# Patient Record
Sex: Female | Born: 1990 | Race: Black or African American | Hispanic: No | Marital: Single | Smoking: Never smoker
Health system: Southern US, Community
[De-identification: ages and names within clinical notes are randomized; demographics above are authoritative.]

## PROBLEM LIST (undated history)

## (undated) DIAGNOSIS — Z789 Other specified health status: Secondary | ICD-10-CM

## (undated) DIAGNOSIS — D75A Glucose-6-phosphate dehydrogenase (G6PD) deficiency without anemia: Secondary | ICD-10-CM

## (undated) HISTORY — DX: Glucose-6-phosphate dehydrogenase (G6PD) deficiency without anemia: D75.A

## (undated) HISTORY — PX: BREAST SURGERY: SHX581

## (undated) HISTORY — PX: NO PAST SURGERIES: SHX2092

---

## 2009-09-11 ENCOUNTER — Emergency Department (HOSPITAL_COMMUNITY): Admission: EM | Admit: 2009-09-11 | Discharge: 2009-09-11 | Payer: Self-pay | Admitting: Family Medicine

## 2009-11-19 ENCOUNTER — Emergency Department (HOSPITAL_COMMUNITY): Admission: EM | Admit: 2009-11-19 | Discharge: 2009-11-19 | Payer: Self-pay | Admitting: Emergency Medicine

## 2009-12-07 ENCOUNTER — Emergency Department (HOSPITAL_BASED_OUTPATIENT_CLINIC_OR_DEPARTMENT_OTHER)
Admission: EM | Admit: 2009-12-07 | Discharge: 2009-12-08 | Payer: Self-pay | Source: Home / Self Care | Admitting: Emergency Medicine

## 2010-08-26 ENCOUNTER — Inpatient Hospital Stay (INDEPENDENT_AMBULATORY_CARE_PROVIDER_SITE_OTHER)
Admission: RE | Admit: 2010-08-26 | Discharge: 2010-08-26 | Disposition: A | Discharge status: Home / Self Care | Payer: BC Managed Care – PPO | Source: Ambulatory Visit | Attending: Family Medicine | Admitting: Family Medicine

## 2010-08-26 ENCOUNTER — Inpatient Hospital Stay (HOSPITAL_COMMUNITY)
Admission: AD | Admit: 2010-08-26 | Discharge: 2010-08-26 | Disposition: A | Discharge status: Home / Self Care | Payer: BC Managed Care – PPO | Source: Ambulatory Visit | Attending: Obstetrics & Gynecology | Admitting: Obstetrics & Gynecology

## 2010-08-26 ENCOUNTER — Encounter (HOSPITAL_COMMUNITY): Payer: Self-pay

## 2010-08-26 ENCOUNTER — Inpatient Hospital Stay (HOSPITAL_COMMUNITY): Payer: BC Managed Care – PPO

## 2010-08-26 DIAGNOSIS — N949 Unspecified condition associated with female genital organs and menstrual cycle: Secondary | ICD-10-CM

## 2010-08-26 DIAGNOSIS — R109 Unspecified abdominal pain: Secondary | ICD-10-CM | POA: Insufficient documentation

## 2010-08-26 HISTORY — DX: Other specified health status: Z78.9

## 2010-08-26 LAB — CBC
Platelets: 269 10*3/uL (ref 150–400)
RDW: 11.7 % (ref 11.5–15.5)
WBC: 5.9 10*3/uL (ref 4.0–10.5)

## 2010-08-26 LAB — WET PREP, GENITAL
Clue Cells Wet Prep HPF POC: NONE SEEN
Trich, Wet Prep: NONE SEEN
Yeast Wet Prep HPF POC: NONE SEEN

## 2010-08-26 LAB — POCT URINALYSIS DIP (DEVICE)
Bilirubin Urine: NEGATIVE
Glucose, UA: NEGATIVE mg/dL
Specific Gravity, Urine: 1.025 (ref 1.005–1.030)
Urobilinogen, UA: 0.2 mg/dL (ref 0.0–1.0)

## 2010-08-26 MED ORDER — IBUPROFEN 600 MG PO TABS: 600.0000 mg | ORAL_TABLET | Freq: Four times a day (QID) | ORAL | Status: AC | PRN

## 2010-08-26 NOTE — Progress Notes (Signed)
Pt states she has been on Depo for 2 years. Has a history of recurrent pyelo that she was recently treated for. Started having abdominal and low back pain 8-21. Went to Urgent Care this am and sent to [facility] for further evaluation.

## 2010-08-26 NOTE — ED Provider Notes (Signed)
History     Chief Complaint  Patient presents with  . Abdominal Pain   HPI Savannah Parrish 20 y.o. sent from [facility] urgent care to be evaluated for abdominal pain.  Currently pain is 7/10.  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  She has had a recent history of pyelo and reports being evaluated today as a follow up for labs after pyelo.  Has had sharp pain in lower abdomen.  Pelvic exam done at Urgent Care today.  Cultures pending.  Client is currently on Depo.  See also chart from Urgent Care earlier today.   OB History    Grav Para Term Preterm Abortions TAB SAB Ect Mult Living   0 0 0 0 0 0 0 0 0 0       Past Medical History  Diagnosis Date  . No pertinent past medical history     Past Surgical History  Procedure Date  . No past surgeries     No family history on file.  History  Substance Use Topics  . Smoking status: Never Smoker   . Smokeless tobacco: Never Used  . Alcohol Use: No    Allergies:  Allergies  Allergen Reactions  . Shellfish Allergy Other (See Comments)    Throat closes    Prescriptions prior to admission  Medication Sig Dispense Refill  . acetaminophen (TYLENOL) 500 MG tablet Take 1,000 mg by mouth as needed. For pain       . EPINEPHrine (EPI-PEN) 0.3 mg/0.3 mL DEVI Inject 0.3 mg into the muscle once. This is an emergent med. Pt has not use in about a year       . ibuprofen (ADVIL,MOTRIN) 200 MG tablet Take 600 mg by mouth as needed. For pain         Review of Systems  Gastrointestinal: Positive for abdominal pain and diarrhea.  Genitourinary:       No vaginal discharge. No vaginal bleeding. No dysuria.     Physical Exam   Blood pressure 116/73, pulse 99, temperature 98.9 F (37.2 C), temperature source Oral, resp. rate 16, height 5' 0.5" (1.537 m), weight 127 lb 3.2 oz (57.698 kg), SpO2 96.00%.  Physical Exam  Nursing note and vitals reviewed. Constitutional: She is oriented to person, place, and time.  She appears well-developed and well-nourished.       Client smiling and very alert.  Very pleasant.  In no distress.  HENT:  Head: Normocephalic.  Eyes: EOM are normal.  Neck: Neck supple.  GI: Soft. Bowel sounds are normal. She exhibits no distension and no mass. There is no tenderness. There is no rebound and no guarding.  Genitourinary:       Pelvic exam done at urgent care.  Musculoskeletal: Normal range of motion.  Neurological: She is alert and oriented to person, place, and time.  Skin: Skin is warm and dry.  Psychiatric: She has a normal mood and affect.    [facility] Course  Procedures Results for orders placed during the hospital encounter of 08/26/10 (from the past 24 hour(s))  CBC     Status: Normal   Collection Time   08/26/10 11:52 AM      Component Value Range   WBC 5.9  4.0 - 10.5 (K/uL)   RBC 4.05  3.87 - 5.11 (MIL/uL)   Hemoglobin 12.7  12.0 - 15.0 (g/dL)   HCT 78.2  95.6 - 21.3 (%)   MCV 93.6  78.0 - 100.0 (fL)  MCH 31.4  26.0 - 34.0 (pg)   MCHC 33.5  30.0 - 36.0 (g/dL)   RDW 40.9  81.1 - 91.4 (%)   Platelets 269  150 - 400 (K/uL)   See also results done at Urgent Care earlier today.  Urinalysis negative for infection, pregnancy test is negative, wet prep - few WBC, all else is negative, GC/Chlam pending.  MDM Reports pain 7/10.  Last pain medication was last night.  Declines any pain medication currently.  *RADIOLOGY REPORT*  Clinical Data: Right lower quadrant pelvic pain  TRANSABDOMINAL AND TRANSVAGINAL ULTRASOUND OF PELVIS  Technique: Both transabdominal and transvaginal ultrasound  examinations of the pelvis were performed. Transabdominal technique  was performed for global imaging of the pelvis including uterus,  ovaries, adnexal regions, and pelvic cul-de-sac.  Comparison: None.  It was necessary to proceed with endovaginal exam following the  transabdominal exam to visualize the ovaries, neither seen  transabdominally, and endometrium.  Findings:    Uterus: 5.9 x 3.4 x 2.3 cm. Anteverted, anteflexed. Normal.  Endometrium: 5 mm. Uniformly thin and echogenic.  Right ovary: 2.7 x 1.8 x 1.2 cm. Normal.  Left ovary: 2.4 x 2.0 x 1.3 cm. Normal.  Other findings: Small amount of free fluid noted in the cul-de-sac.  IMPRESSION:  Normal exam.   Assessment and Plan  Abdominal pain  Plan: Follow up with medical provider of choice. BRAT diet. Will prescribe Ibuprofen for pain.  Argus Caraher 08/26/2010, 11:59 AM

## 2010-08-27 LAB — GC/CHLAMYDIA PROBE AMP, GENITAL
Chlamydia, DNA Probe: NEGATIVE
GC Probe Amp, Genital: NEGATIVE

## 2011-03-12 ENCOUNTER — Emergency Department (HOSPITAL_COMMUNITY)
Admission: EM | Admit: 2011-03-12 | Discharge: 2011-03-12 | Disposition: A | Discharge status: Home Health | Payer: BC Managed Care – PPO | Attending: Emergency Medicine | Admitting: Emergency Medicine

## 2011-03-12 ENCOUNTER — Encounter (HOSPITAL_COMMUNITY): Payer: Self-pay | Admitting: *Deleted

## 2011-03-12 DIAGNOSIS — E860 Dehydration: Secondary | ICD-10-CM | POA: Insufficient documentation

## 2011-03-12 DIAGNOSIS — R599 Enlarged lymph nodes, unspecified: Secondary | ICD-10-CM | POA: Insufficient documentation

## 2011-03-12 DIAGNOSIS — Z79899 Other long term (current) drug therapy: Secondary | ICD-10-CM | POA: Insufficient documentation

## 2011-03-12 DIAGNOSIS — R221 Localized swelling, mass and lump, neck: Secondary | ICD-10-CM | POA: Insufficient documentation

## 2011-03-12 DIAGNOSIS — R220 Localized swelling, mass and lump, head: Secondary | ICD-10-CM | POA: Insufficient documentation

## 2011-03-12 DIAGNOSIS — IMO0001 Reserved for inherently not codable concepts without codable children: Secondary | ICD-10-CM | POA: Insufficient documentation

## 2011-03-12 DIAGNOSIS — J020 Streptococcal pharyngitis: Secondary | ICD-10-CM | POA: Insufficient documentation

## 2011-03-12 DIAGNOSIS — R509 Fever, unspecified: Secondary | ICD-10-CM | POA: Insufficient documentation

## 2011-03-12 LAB — POCT I-STAT, CHEM 8
Calcium, Ion: 1.21 mmol/L (ref 1.12–1.32)
HCT: 38 % (ref 36.0–46.0)
TCO2: 21 mmol/L (ref 0–100)

## 2011-03-12 LAB — RAPID STREP SCREEN (MED CTR MEBANE ONLY): Streptococcus, Group A Screen (Direct): POSITIVE — AB

## 2011-03-12 MED ORDER — DEXAMETHASONE SODIUM PHOSPHATE 10 MG/ML IJ SOLN
10.0000 mg | Freq: Once | INTRAMUSCULAR | Status: AC
  Administered 2011-03-12: 10 mg via INTRAVENOUS
  Filled 2011-03-12: qty 1

## 2011-03-12 MED ORDER — PENICILLIN G BENZATHINE 1200000 UNIT/2ML IM SUSP
1.2000 10*6.[IU] | Freq: Once | INTRAMUSCULAR | Status: AC
  Administered 2011-03-12: 1.2 10*6.[IU] via INTRAMUSCULAR
  Filled 2011-03-12: qty 2

## 2011-03-12 MED ORDER — SODIUM CHLORIDE 0.9 % IV BOLUS (SEPSIS)
500.0000 mL | Freq: Once | INTRAVENOUS | Status: AC
  Administered 2011-03-12: 500 mL via INTRAVENOUS

## 2011-03-12 MED ORDER — ONDANSETRON HCL 4 MG/2ML IJ SOLN
4.0000 mg | Freq: Once | INTRAMUSCULAR | Status: AC
  Administered 2011-03-12: 4 mg via INTRAVENOUS
  Filled 2011-03-12: qty 2

## 2011-03-12 MED ORDER — HYDROCODONE-ACETAMINOPHEN 7.5-325 MG/15ML PO SOLN: 15.0000 mL | ORAL | Status: AC | PRN

## 2011-03-12 MED ORDER — MORPHINE SULFATE 4 MG/ML IJ SOLN
4.0000 mg | Freq: Once | INTRAMUSCULAR | Status: AC
  Administered 2011-03-12: 4 mg via INTRAVENOUS
  Filled 2011-03-12: qty 1

## 2011-03-12 MED ORDER — SODIUM CHLORIDE 0.9 % IV BOLUS (SEPSIS)
1000.0000 mL | Freq: Once | INTRAVENOUS | Status: AC
  Administered 2011-03-12: 1000 mL via INTRAVENOUS

## 2011-03-12 NOTE — ED Notes (Signed)
sorethroat and chills since yesterday.  No other complaints

## 2011-03-12 NOTE — ED Provider Notes (Signed)
History     CSN: [id number]  Arrival date & time 03/12/11  2026   First MD Initiated Contact with Patient 03/12/11 2104      Chief Complaint  Patient presents with  . Sore Throat     Patient is a 21 y.o. female presenting with pharyngitis.  Sore Throat This is a new problem. The current episode started yesterday. The problem occurs constantly. The problem has been gradually worsening. Associated symptoms include a fever, myalgias, a sore throat and swollen glands. Pertinent negatives include no abdominal pain, chills, congestion, coughing, headaches, nausea, neck pain, rash, urinary symptoms or vomiting. The symptoms are aggravated by swallowing. She has tried acetaminophen and NSAIDs for the symptoms. The treatment provided no relief.   patient reports onset of severe sore throat yesterday. Pain is worse on the left side. This made it very painful for patient to eat or drink. Denies nausea, vomiting, diarrhea and cough. Does state that she feels as though she has had fever.  Past Medical History  Diagnosis Date  . No pertinent past medical history     Past Surgical History  Procedure Date  . No past surgeries     History reviewed. No pertinent family history.  History  Substance Use Topics  . Smoking status: Never Smoker   . Smokeless tobacco: Never Used  . Alcohol Use: No    OB History    Grav Para Term Preterm Abortions TAB SAB Ect Mult Living   0 0 0 0 0 0 0 0 0 0       Review of Systems  Constitutional: Positive for fever. Negative for chills.  HENT: Positive for sore throat. Negative for congestion and neck pain.   Eyes: Negative.   Respiratory: Negative.  Negative for cough.   Cardiovascular: Negative.   Gastrointestinal: Negative.  Negative for nausea, vomiting and abdominal pain.  Genitourinary: Negative.   Musculoskeletal: Positive for myalgias.  Skin: Negative.  Negative for rash.  Neurological: Negative.  Negative for headaches.  Hematological:  Negative.   Psychiatric/Behavioral: Negative.     Allergies  Shellfish allergy  Home Medications   Current Outpatient Rx  Name Route Sig Dispense Refill  . EPINEPHRINE 0.3 MG/0.3ML IJ DEVI Intramuscular Inject 0.3 mg into the muscle once. This is an emergent med. Pt has not use in about a year     . SERTRALINE HCL 25 MG PO TABS Oral Take 25 mg by mouth daily.      BP 110/75  Pulse 96  Temp(Src) 98.6 F (37 C) (Oral)  Resp 16  SpO2 99%  Physical Exam  Constitutional: She is oriented to person, place, and time. She appears well-developed and well-nourished.  HENT:  Head: Normocephalic and atraumatic.  Right Ear: Tympanic membrane and external ear normal.  Left Ear: Tympanic membrane, external ear and ear canal normal.  Nose: Nose normal.  Mouth/Throat: Uvula is midline. Posterior oropharyngeal edema, posterior oropharyngeal erythema and tonsillar abscesses present.       Bilateral tonsils grossly swollen (2+) and erythematous with purulent exudate noted  Eyes: Conjunctivae are normal.  Neck: Neck supple.  Cardiovascular: Normal rate and regular rhythm.   Pulmonary/Chest: Effort normal and breath sounds normal.  Abdominal: Soft. Bowel sounds are normal.  Musculoskeletal: Normal range of motion.  Neurological: She is alert and oriented to person, place, and time.  Skin: Skin is warm and dry. No erythema.  Psychiatric: She has a normal mood and affect.    ED Course  Procedures  Findings single impression discussed with patient. Will plan for discharge home with short course of liquid medication for pain and encourage follow up with her primary care physician as needed. Patient is to return for worsening symptoms. Patient requesting ENT referral as she has frequent tonsillitis.  Labs Reviewed  RAPID STREP SCREEN - Abnormal; Notable for the following:    Streptococcus, Group A Screen (Direct) POSITIVE (*)    All other components within normal limits  POCT I-STAT, CHEM 8 -  Abnormal; Notable for the following:    Potassium 3.3 (*)    All other components within normal limits   No results found.   No diagnosis found.    MDM  HPI/PE and clinical findings c/w 1. Strep Pharyngitis 2. Dehydration        Leanne Chang, NP 03/12/11 [phone]

## 2011-03-12 NOTE — Discharge Instructions (Signed)
Please review the instructions below. You were treated in the emergency department tonight for your sore throat. We have treated you for her strep pharyngitis (strep throat). You were also quite dehydrated since the pain you're throat is made difficult to eat and drink. Your dehydration is much improved after IV fluids. Take the liquid pain medication as needed, drink plenty of fluids and followup with your primary care physician as needed. We have provided a referral to the ENT specialist as you requested. Return for worsening symptoms otherwise follow up as discussed.     Dehydration, Adult Dehydration means your body does not have as much fluid as it needs. Your kidneys, brain, and heart will not work properly without the right amount of fluids and salt.  HOME CARE  Ask your doctor how to replace body fluid losses (rehydrate).   Drink enough fluids to keep your pee (urine) clear or pale yellow.   Drink small amounts of fluids often if you feel sick to your stomach (nauseous) or throw up (vomit).   Eat like you normally do.   Avoid:   Foods or drinks high in sugar.   Bubbly (carbonated) drinks.   Juice.   Very hot or cold fluids.   Drinks with caffeine.   Fatty, greasy foods.   Alcohol.   Tobacco.   Eating too much.   Gelatin desserts.   Wash your hands to avoid spreading germs (bacteria, viruses).   Only take medicine as told by your doctor.   Keep all doctor visits as told.  GET HELP RIGHT AWAY IF:   You cannot drink something without throwing up.   You get worse even with treatment.   Your vomit has blood in it or looks greenish.   Your poop (stool) has blood in it or looks black and tarry.   You have not peed in 6 to 8 hours.   You pee a small amount of very dark pee.   You have a fever.   You pass out (faint).   You have belly (abdominal) pain that gets worse or stays in one spot (localizes).   You have a rash, stiff neck, or bad headache.    You get easily annoyed, sleepy, or are hard to wake up.   You feel weak, dizzy, or very thirsty.  MAKE SURE YOU:   Understand these instructions.   Will watch your condition.   Will get help right away if you are not doing well or get worse.  Document Released: 10/17/2008 Document Revised: 12/10/2010 Document Reviewed: 08/10/2010 [facility] Patient Information 2012 [place], [place].Salt Water Gargle This solution will help make your mouth and throat feel better. HOME CARE INSTRUCTIONS   Mix 1 teaspoon of salt in 8 ounces of warm water.   Gargle with this solution as much or often as you need or as directed. Swish and gargle gently if you have any sores or wounds in your mouth.   Do not swallow this mixture.  Document Released: 09/25/2003 Document Revised: 12/10/2010 Document Reviewed: 02/16/2008 [facility] Patient Information 2012 [place], [place].Strep Throat Strep throat is an infection of the throat. It is caused by a germ. Strep throat spreads from person to person by coughing, sneezing, or close contact. HOME CARE  Rinse your mouth (gargle) with warm salt water (1 teaspoon salt in 1 cup of water). Do this 3 to 4 times per day or as needed for comfort.   Family members with a sore throat or fever should see a doctor.  Make sure everyone in your house washes their hands well.   Do not share food, drinking cups, or personal items.   Eat soft foods until your sore throat gets better.   Drink enough water and fluids to keep your pee (urine) clear or pale yellow.   Rest.   Stay home from school, daycare, or work until you have taken medicine for 24 hours.   Only take medicine as told by your doctor.   Take your medicine as told. Finish it even if you start to feel better.  GET HELP RIGHT AWAY IF:   You have new problems, such as throwing up (vomiting) or bad headaches.   You have a stiff or painful neck, chest pain, trouble breathing, or trouble swallowing.   You have  very bad throat pain, drooling, or changes in your voice.   Your neck puffs up (swells) or gets red and tender.   You have a fever.   You are very tired, your mouth is dry, or you are peeing less than normal.   You cannot wake up completely.   You get a rash, cough, or earache.   You have green, yellow-brown, or bloody spit.   Your pain does not get better with medicine.  MAKE SURE YOU:   Understand these instructions.   Will watch your condition.   Will get help right away if you are not doing well or get worse.  Document Released: 06/09/2007 Document Revised: 12/10/2010 Document Reviewed: 02/19/2010 [facility] Patient Information 2012 [place], [place].

## 2011-03-13 NOTE — ED Provider Notes (Signed)
Medical screening examination/treatment/procedure(s) were performed by non-physician practitioner and as supervising physician I was immediately available for consultation/collaboration.   Nat Christen, MD 03/13/11 [phone]

## 2011-04-28 ENCOUNTER — Encounter (HOSPITAL_COMMUNITY): Payer: Self-pay | Admitting: *Deleted

## 2011-04-28 ENCOUNTER — Emergency Department (INDEPENDENT_AMBULATORY_CARE_PROVIDER_SITE_OTHER)
Admission: EM | Admit: 2011-04-28 | Discharge: 2011-04-28 | Disposition: A | Discharge status: Home / Self Care | Payer: BC Managed Care – PPO | Source: Home / Self Care | Attending: Emergency Medicine | Admitting: Emergency Medicine

## 2011-04-28 DIAGNOSIS — R102 Pelvic and perineal pain: Secondary | ICD-10-CM

## 2011-04-28 DIAGNOSIS — N949 Unspecified condition associated with female genital organs and menstrual cycle: Secondary | ICD-10-CM

## 2011-04-28 LAB — POCT URINALYSIS DIP (DEVICE)
Leukocytes, UA: NEGATIVE
Nitrite: NEGATIVE
Protein, ur: NEGATIVE mg/dL
pH: 6 (ref 5.0–8.0)

## 2011-04-28 LAB — POCT PREGNANCY, URINE: Preg Test, Ur: NEGATIVE

## 2011-04-28 MED ORDER — POLYETHYLENE GLYCOL 3350 17 GM/SCOOP PO POWD: 17.0000 g | Freq: Two times a day (BID) | ORAL | Status: AC

## 2011-04-28 NOTE — ED Provider Notes (Signed)
Medical screening examination/treatment/procedure(s) were performed by resident physician,I'M supervising physician I was immediately available for consultation/collaboration.I have examined and discussed management and treatment plan and follow-up with patient as well.    Jimmie Molly, MD 04/28/11 [phone]

## 2011-04-28 NOTE — ED Notes (Signed)
Pt is here with complaints of lower abdominal pain X "a few months" with nausea around 4 or 5 am and generalized malaise.  Pt thinks she might be pregnant although a home test was negative.  Pt does report occasional chest discomfort after eating a meal.  Denies possible STD exposure, last STD screening in January.

## 2011-04-28 NOTE — ED Provider Notes (Signed)
History     CSN: [id number]  Arrival date & time 04/28/11  1003   First MD Initiated Contact with Patient 04/28/11 1137      Chief Complaint  Patient presents with  . Abdominal Pain    (Consider location/radiation/quality/duration/timing/severity/associated sxs/prior treatment) HPI Patient presents with pelvic pain x3 months. She says that over the last week her pain has been getting slightly worse. She describes it as crampy. She has not tried anything for this pain. She describes this pain as bilateral pelvic as well as suprapubic. She has not had any burning when she pees but she has had increased frequency. She has not had any discharge, odor, itch. She has not had a menstrual period since she started the nexplanon in January. She is concerned she is pregnant because she had unprotected sex in January. She said that she has been screened for STDs since then.  Patient states that every morning at 4 AM she'll wake up with a feeling of nausea. The last several months she is possibly vomited once or twice but it is not frequent. She has not had an acid taste in her mouth and she does not have heartburn symptoms.   She says that she has bowel movements every other day that are small hard balls. This has been going on for months. . Past Medical History  Diagnosis Date  . No pertinent past medical history     Past Surgical History  Procedure Date  . No past surgeries     History reviewed. No pertinent family history.  History  Substance Use Topics  . Smoking status: Never Smoker   . Smokeless tobacco: Never Used  . Alcohol Use: No    OB History    Grav Para Term Preterm Abortions TAB SAB Ect Mult Living   0 0 0 0 0 0 0 0 0 0       Review of Systems As above, otherwise negative Allergies  Shellfish allergy  Home Medications   Current Outpatient Rx  Name Route Sig Dispense Refill  . EPINEPHRINE 0.3 MG/0.3ML IJ DEVI Intramuscular Inject 0.3 mg into the muscle once. This  is an emergent med. Pt has not use in about a year     . POLYETHYLENE GLYCOL 3350 PO POWD Oral Take 17 g by mouth 2 (two) times daily. 527 g 0  . SERTRALINE HCL 25 MG PO TABS Oral Take 25 mg by mouth daily.      BP 120/76  Pulse 83  Temp(Src) 98.4 F (36.9 C) (Oral)  Resp 18  SpO2 97%  Physical Exam Vital signs reviewed General appearance - alert, well appearing, and in no distress Heart - normal rate, regular rhythm, normal S1, S2, no murmurs, rubs, clicks or gallops Chest - clear to auscultation, no wheezes, rales or rhonchi, symmetric air entry, no tachypnea, retractions or cyanosis Abdomen - soft, mild tenderness suprapubic and bilateral pelvic, nondistended, no masses or organomegaly GYN-patient is tender at the introitus. She has normal appearance externally. Internally, her vagina is normal color and with some thin white discharge present. her cervix has some white discharge that is non-purulent-appearing. She has some tenderness with motion of the cervix. Her uterus is normal size and shape. ED Course  Procedures (including critical care time)  Labs Reviewed  POCT URINALYSIS DIP (DEVICE) - Abnormal; Notable for the following:    Ketones, ur TRACE (*)    All other components within normal limits  POCT PREGNANCY, URINE  WET PREP, GENITAL  GC/CHLAMYDIA PROBE AMP, GENITAL   No results found.   1. Pelvic pain       MDM  1. Pelvic pain-patient with several months of pelvic pain. She has some tenderness with cervical motion but her discharge appears vaginal and normal in character. Her urine is normal on the dipstick. I have sent gonorrhea and chlamydia as well as wet prep. Patient has been tested for STDs since her last sexual encounter, so she does not feel she needs to be treated empirically today. As she has some symptoms of constipation, I have given her MiraLAX. I have reviewed with her that if her STD testing is negative but her symptoms do not improve, she is to follow  up with GYN. I have given her a referral today. I have asked her to use Aleve twice a day x5 days to help with her symptoms.        Reginold Agent, MD 04/28/11 1228

## 2011-04-28 NOTE — Discharge Instructions (Signed)
Please take Aleve twice a day for 5 days with food Please take MiraLAX for constipation twice a day Please see the GYN doctor if you're not feeling better in 7-10 days Abdominal Pain, Women Abdominal (stomach, pelvic, or belly) pain can be caused by many things. It is important to tell your doctor:  The location of the pain.   Does it come and go or is it present all the time?   Are there things that start the pain (eating certain foods, exercise)?   Are there other symptoms associated with the pain (fever, nausea, vomiting, diarrhea)?  All of this is helpful to know when trying to find the cause of the pain. CAUSES   Stomach: virus or bacteria infection, or ulcer.   Intestine: appendicitis (inflamed appendix), regional ileitis (Crohn's disease), ulcerative colitis (inflamed colon), irritable bowel syndrome, diverticulitis (inflamed diverticulum of the colon), or cancer of the stomach or intestine.   Gallbladder disease or stones in the gallbladder.   Kidney disease, kidney stones, or infection.   Pancreas infection or cancer.   Fibromyalgia (pain disorder).   Diseases of the female organs:   Uterus: fibroid (non-cancerous) tumors or infection.   Fallopian tubes: infection or tubal pregnancy.   Ovary: cysts or tumors.   Pelvic adhesions (scar tissue).   Endometriosis (uterus lining tissue growing in the pelvis and on the pelvic organs).   Pelvic congestion syndrome (female organs filling up with blood just before the menstrual period).   Pain with the menstrual period.   Pain with ovulation (producing an egg).   Pain with an IUD (intrauterine device, birth control) in the uterus.   Cancer of the female organs.   Functional pain (pain not caused by a disease, may improve without treatment).   Psychological pain.   Depression.  DIAGNOSIS  Your doctor will decide the seriousness of your pain by doing an examination.  Blood tests.   X-rays.   Ultrasound.    CT scan (computed tomography, special type of X-ray).   MRI (magnetic resonance imaging).   Cultures, for infection.   Barium enema (dye inserted in the large intestine, to better view it with X-rays).   Colonoscopy (looking in intestine with a lighted tube).   Laparoscopy (minor surgery, looking in abdomen with a lighted tube).   Major abdominal exploratory surgery (looking in abdomen with a large incision).  TREATMENT  The treatment will depend on the cause of the pain.   Many cases can be observed and treated at home.   Over-the-counter medicines recommended by your caregiver.   Prescription medicine.   Antibiotics, for infection.   Birth control pills, for painful periods or for ovulation pain.   Hormone treatment, for endometriosis.   Nerve blocking injections.   Physical therapy.   Antidepressants.   Counseling with a psychologist or psychiatrist.   Minor or major surgery.  HOME CARE INSTRUCTIONS   Do not take laxatives, unless directed by your caregiver.   Take over-the-counter pain medicine only if ordered by your caregiver. Do not take aspirin because it can cause an upset stomach or bleeding.   Try a clear liquid diet (broth or water) as ordered by your caregiver. Slowly move to a bland diet, as tolerated, if the pain is related to the stomach or intestine.   Have a thermometer and take your temperature several times a day, and record it.   Bed rest and sleep, if it helps the pain.   Avoid sexual intercourse, if it causes pain.  Avoid stressful situations.   Keep your follow-up appointments and tests, as your caregiver orders.   If the pain does not go away with medicine or surgery, you may try:   Acupuncture.   Relaxation exercises (yoga, meditation).   Group therapy.   Counseling.  SEEK MEDICAL CARE IF:   You notice certain foods cause stomach pain.   Your home care treatment is not helping your pain.   You need stronger pain  medicine.   You want your IUD removed.   You feel faint or lightheaded.   You develop nausea and vomiting.   You develop a rash.   You are having side effects or an allergy to your medicine.  SEEK IMMEDIATE MEDICAL CARE IF:   Your pain does not go away or gets worse.   You have a fever.   Your pain is felt only in portions of the abdomen. The right side could possibly be appendicitis. The left lower portion of the abdomen could be colitis or diverticulitis.   You are passing blood in your stools (bright red or black tarry stools, with or without vomiting).   You have blood in your urine.   You develop chills, with or without a fever.   You pass out.  MAKE SURE YOU:   Understand these instructions.   Will watch your condition.   Will get help right away if you are not doing well or get worse.  Document Released: 10/18/2006 Document Revised: 12/10/2010 Document Reviewed: 11/07/2008 [facility] Patient Information 2012 [place], [place].

## 2011-04-30 ENCOUNTER — Telehealth (HOSPITAL_COMMUNITY): Payer: Self-pay | Admitting: *Deleted

## 2011-04-30 LAB — GC/CHLAMYDIA PROBE AMP, GENITAL: Chlamydia, DNA Probe: POSITIVE — AB

## 2011-04-30 NOTE — ED Notes (Signed)
Chlamydia positive.  Pt called and verified X 2.  Rx for Azithromycin 1gm PO X 1 called to [facility] on [address] per Dr. Chaney Malling.  Pt. instructed to notify their partner, no sex for 1 week and to practice safe sex. Pt. told they can get HIV testing at the [facility] STD clinic.

## 2011-04-30 NOTE — ED Notes (Signed)
Pt called this afternoon stating that she took the medicine that was called in for her and it has made feel sick to her stomach.  She states she had to call out of work and was requesting a work note.  I told her that she could have it for today only, to return tomorrow.  Work note printed and left at registration for pt to pick up.  Pt informed that she would need to come in to pick it up.  Pt voiced understanding.

## 2011-07-07 ENCOUNTER — Emergency Department (HOSPITAL_COMMUNITY)
Admission: EM | Admit: 2011-07-07 | Discharge: 2011-07-07 | Disposition: A | Discharge status: Home / Self Care | Payer: No Typology Code available for payment source | Attending: Emergency Medicine | Admitting: Emergency Medicine

## 2011-07-07 ENCOUNTER — Emergency Department (HOSPITAL_COMMUNITY): Payer: No Typology Code available for payment source

## 2011-07-07 ENCOUNTER — Encounter (HOSPITAL_COMMUNITY): Payer: Self-pay | Admitting: Physical Medicine and Rehabilitation

## 2011-07-07 DIAGNOSIS — R51 Headache: Secondary | ICD-10-CM

## 2011-07-07 DIAGNOSIS — M25519 Pain in unspecified shoulder: Secondary | ICD-10-CM | POA: Insufficient documentation

## 2011-07-07 DIAGNOSIS — M546 Pain in thoracic spine: Secondary | ICD-10-CM | POA: Insufficient documentation

## 2011-07-07 DIAGNOSIS — M549 Dorsalgia, unspecified: Secondary | ICD-10-CM

## 2011-07-07 MED ORDER — KETOROLAC TROMETHAMINE 30 MG/ML IJ SOLN
30.0000 mg | Freq: Once | INTRAMUSCULAR | Status: AC
  Administered 2011-07-07: 30 mg via INTRAMUSCULAR
  Filled 2011-07-07: qty 1

## 2011-07-07 MED ORDER — METHOCARBAMOL 500 MG PO TABS: 500.0000 mg | ORAL_TABLET | Freq: Four times a day (QID) | ORAL | Status: AC | PRN

## 2011-07-07 MED ORDER — METHOCARBAMOL 500 MG PO TABS
1000.0000 mg | ORAL_TABLET | Freq: Once | ORAL | Status: AC
  Administered 2011-07-07: 1000 mg via ORAL
  Filled 2011-07-07: qty 2

## 2011-07-07 MED ORDER — NAPROXEN 375 MG PO TABS: 375.0000 mg | ORAL_TABLET | Freq: Two times a day (BID) | ORAL | Status: AC
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
## 2011-07-07 NOTE — ED Notes (Signed)
Patient transported to X-ray 
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
## 2011-07-08 NOTE — ED Provider Notes (Signed)
Medical screening examination/treatment/procedure(s) were performed by non-physician practitioner and as supervising physician I was immediately available for consultation/collaboration.   Dione Booze, MD 07/08/11 1321

## 2012-10-21 ENCOUNTER — Ambulatory Visit (INDEPENDENT_AMBULATORY_CARE_PROVIDER_SITE_OTHER): Payer: BC Managed Care – PPO | Admitting: Family Medicine

## 2012-10-21 VITALS — BP 100/60 | HR 81 | Temp 98.7°F | Resp 16 | Ht 62.0 in | Wt 125.8 lb

## 2012-10-21 DIAGNOSIS — K055 Other periodontal diseases: Secondary | ICD-10-CM

## 2012-10-21 DIAGNOSIS — K068 Other specified disorders of gingiva and edentulous alveolar ridge: Secondary | ICD-10-CM

## 2012-10-21 DIAGNOSIS — E7401 von Gierke disease: Secondary | ICD-10-CM

## 2012-10-21 DIAGNOSIS — K0889 Other specified disorders of teeth and supporting structures: Secondary | ICD-10-CM

## 2012-10-21 DIAGNOSIS — E7400 Glycogen storage disease, unspecified: Secondary | ICD-10-CM

## 2012-10-21 DIAGNOSIS — Z91013 Allergy to seafood: Secondary | ICD-10-CM | POA: Insufficient documentation

## 2012-10-21 LAB — POCT CBC
Lymph, poc: 1.6 (ref 0.6–3.4)
MCH, POC: 32.6 pg — AB (ref 27–31.2)
MCHC: 31.6 g/dL — AB (ref 31.8–35.4)
MCV: 103.1 fL — AB (ref 80–97)
MID (cbc): 0.4 (ref 0–0.9)
POC LYMPH PERCENT: 28.4 %L (ref 10–50)
POC MID %: 6.1 %M (ref 0–12)
Platelet Count, POC: 231 10*3/uL (ref 142–424)
RDW, POC: 12.3 %
WBC: 5.8 10*3/uL (ref 4.6–10.2)

## 2012-10-21 LAB — APTT: aPTT: 35 seconds (ref 24–37)

## 2012-10-21 MED ORDER — EPINEPHRINE 0.3 MG/0.3ML IJ SOAJ: 0.3000 mg | Freq: Once | INTRAMUSCULAR | Status: AC

## 2012-10-21 MED ORDER — AMOXICILLIN 500 MG PO CAPS: 1000.0000 mg | ORAL_CAPSULE | Freq: Two times a day (BID) | ORAL | Status: DC

## 2012-10-21 NOTE — Patient Instructions (Signed)
1.  Return for worsening bleeding.   2.  Avoid chewing on that side. 3.  Follow-up with dentist if no improvement by Monday.

## 2012-10-21 NOTE — Progress Notes (Signed)
Subjective:    Patient ID: Savannah Parrish, female    DOB: 1990/04/14, 22 y.o.   MRN: [id number]  HPI Had 2 dental fillings 4 days ago and has noticed frequent blood clots and bleeding around the area since this morning. Had blood on pillow and hands when awakened this morning. Had no pain until this morning, but area started hurting and is getting worse. She has not flossed this area since the fillings were placed, but reports that the area was flossed after the fillings and that the hygienist had difficulty getting the floss through.  Flossing was painful at dentist. No fever.   [employer] the [employer]. Works and will go back to school next semester.   Was diagnosed with G6PD deficiency while applying to the [employer]. Has not had problems with bleeding. Had a breast node biopsy a couple of months ago that was negative. Tolerated surgery without bleeding.  Has shellfish allergy with throat closing. Does not have an epi pen, hers expired. Does not carry benadryl.  Review of Systems  HENT: Negative for ear pain and facial swelling.    No hematuria, no vaginal bleeding. No other bleeding.    Objective:   Physical Exam  Constitutional: She appears well-developed and well-nourished.  HENT:  Head: Normocephalic and atraumatic.  Mouth/Throat: No oral lesions. No dental abscesses, uvula swelling, lacerations or dental caries. No oropharyngeal exudate or posterior oropharyngeal edema.    Clot and blood oozing noted between last upper, right molar and second molar along gumline of tooth. No swelling. Nontender with cleaning with gauze.  Mild active bleeding with good clot formation.  No lesions, ulcerations, fluctuants; non-tender.  Eyes: Pupils are equal, round, and reactive to light.  Neck: Normal range of motion. Neck supple.  Lymphadenopathy:    She has no cervical adenopathy.    Results for orders placed in visit on 10/21/12  POCT CBC      Result Value Range   WBC 5.8  4.6 - 10.2  K/uL   Lymph, poc 1.6  0.6 - 3.4   POC LYMPH PERCENT 28.4  10 - 50 %L   MID (cbc) 0.4  0 - 0.9   POC MID % 6.1  0 - 12 %M   POC Granulocyte 3.8  2 - 6.9   Granulocyte percent 65.5  37 - 80 %G   RBC 4.02 (*) 4.04 - 5.48 M/uL   Hemoglobin 13.1  12.2 - 16.2 g/dL   HCT, POC 11.9  14.7 - 47.9 %   MCV 103.1 (*) 80 - 97 fL   MCH, POC 32.6 (*) 27 - 31.2 pg   MCHC 31.6 (*) 31.8 - 35.4 g/dL   RDW, POC 82.9     Platelet Count, POC 231  142 - 424 K/uL   MPV 9.6  0 - 99.8 fL    PROCEDURE: 2X2 GAUZE APPLIED ALONG UPPER GUM LINE ON R FOR HEMOSTASIS.     Assessment & Plan:  Gingival bleeding - Plan: POCT CBC, Protime-INR, APTT  Pain, dental  G6P deficiency (glucose-6-phosphatase deficiency)  Shellfish allergy  1. Gingival bleeding R upper gumline:  New.  Secondary to traumatic injury from difficult flossing with dental cleaning 72 hours ago.  CBC stable; obtain PT, PTT.  Apply gauze to area for pressure at site of bleeding throughout the day.  Rx for Amoxicillin provided for early onset dental infection. 2.  Pain dental R upper gumline:  New. Onset in past 24 hours after dental cleaning with filling  placement for dental carie.  Rx for Amoxicillin provided for secondary infection. 3.  G6P deficiency:  Stable; no evidence of anemia.   4. Shellfish allergy: stable; refil of Epipen provided.  Meds ordered this encounter  Medications  . amoxicillin (AMOXIL) 500 MG capsule    Sig: Take 2 capsules (1,000 mg total) by mouth 2 (two) times daily.    Dispense:  40 capsule    Refill:  0  . EPINEPHrine (EPIPEN) 0.3 mg/0.3 mL SOAJ injection    Sig: Inject 0.3 mLs (0.3 mg total) into the muscle once.    Dispense:  2 Device    Refill:  3

## 2012-12-24 ENCOUNTER — Encounter (HOSPITAL_COMMUNITY): Payer: Self-pay | Admitting: Emergency Medicine

## 2012-12-24 ENCOUNTER — Emergency Department (HOSPITAL_COMMUNITY)
Admission: EM | Admit: 2012-12-24 | Discharge: 2012-12-24 | Disposition: A | Discharge status: Home / Self Care | Payer: Worker's Compensation | Attending: Emergency Medicine | Admitting: Emergency Medicine

## 2012-12-24 ENCOUNTER — Emergency Department (HOSPITAL_COMMUNITY): Payer: Worker's Compensation

## 2012-12-24 DIAGNOSIS — Y939 Activity, unspecified: Secondary | ICD-10-CM | POA: Diagnosis not present

## 2012-12-24 DIAGNOSIS — Y929 Unspecified place or not applicable: Secondary | ICD-10-CM | POA: Diagnosis not present

## 2012-12-24 DIAGNOSIS — W230XXA Caught, crushed, jammed, or pinched between moving objects, initial encounter: Secondary | ICD-10-CM | POA: Insufficient documentation

## 2012-12-24 DIAGNOSIS — S6991XA Unspecified injury of right wrist, hand and finger(s), initial encounter: Secondary | ICD-10-CM

## 2012-12-24 DIAGNOSIS — S6990XA Unspecified injury of unspecified wrist, hand and finger(s), initial encounter: Secondary | ICD-10-CM | POA: Insufficient documentation

## 2012-12-24 DIAGNOSIS — S6980XA Other specified injuries of unspecified wrist, hand and finger(s), initial encounter: Secondary | ICD-10-CM | POA: Diagnosis present

## 2012-12-24 MED ORDER — HYDROCODONE-ACETAMINOPHEN 5-325 MG PO TABS
1.0000 | ORAL_TABLET | Freq: Once | ORAL | Status: AC
  Administered 2012-12-24: 1 via ORAL
  Filled 2012-12-24: qty 1

## 2012-12-24 MED ORDER — HYDROCODONE-ACETAMINOPHEN 5-325 MG PO TABS
2.0000 | ORAL_TABLET | Freq: Once | ORAL | Status: AC
  Administered 2012-12-24: 1 via ORAL
  Filled 2012-12-24: qty 1

## 2012-12-24 NOTE — ED Notes (Signed)
Pain, swelling in r/index finger after a safe door closed on the side on finger. Small laceration on side of nail bed. Bleeding controlled

## 2012-12-24 NOTE — ED Provider Notes (Signed)
CSN: [id number]     Arrival date & time 12/24/12  1854 History   This chart was scribed for non-physician practitioner Marlon Pel, PA-C, working with Doug Sou, MD, by Yevette Edwards, ED Scribe. This patient was seen in room WTR8/WTR8 and the patient's care was started at 8:07 PM.  First MD Initiated Contact with Patient 12/24/12 1855     Chief Complaint  Patient presents with  . Finger Injury    index finger r/hand, crush injury - caught in door of safe    The history is provided by the patient. No language interpreter was used.   HPI Comments: Savannah Parrish is a 22 y.o. female, with G6PD deficiency, who presents to the Emergency Department complaining of a laceration to her right index finger which occurred 90 minutes ago when her finger was accidentally closed in an industrial-sized safe. She has experienced pain, which she characterizes as "burning" and "throbbing," and swelling to the site. The pt reports limited ROM associated with the finger. She is a non-smoker.   Past Medical History  Diagnosis Date  . No pertinent past medical history   . G6PD deficiency    Past Surgical History  Procedure Laterality Date  . No past surgeries    . Breast surgery     Family History  Problem Relation Age of Onset  . Diabetes Mother    History  Substance Use Topics  . Smoking status: Never Smoker   . Smokeless tobacco: Never Used  . Alcohol Use: Yes   OB History   Grav Para Term Preterm Abortions TAB SAB Ect Mult Living   0 0 0 0 0 0 0 0 0 0      Review of Systems  Skin: Positive for wound.  All other systems reviewed and are negative.    Allergies  Shellfish allergy  Home Medications   Current Outpatient Rx  Name  Route  Sig  Dispense  Refill  . EPINEPHrine (EPIPEN) 0.3 mg/0.3 mL SOAJ injection   Intramuscular   Inject 0.3 mLs (0.3 mg total) into the muscle once.   2 Device   3    Triage Vitals: BP 123/82  Pulse 93  Temp(Src) 97.9 F (36.6 C) (Oral)   Resp 18  SpO2 100%  Physical Exam  Nursing note and vitals reviewed. Constitutional: She is oriented to person, place, and time. She appears well-developed and well-nourished. No distress.  HENT:  Head: Normocephalic and atraumatic.  Eyes: EOM are normal.  Neck: Neck supple. No tracheal deviation present.  Cardiovascular: Normal rate.   Pulmonary/Chest: Effort normal. No respiratory distress.  Musculoskeletal: Normal range of motion.  Neurological: She is alert and oriented to person, place, and time.  Skin: Skin is warm and dry.  Partially avolsed nail to the right index finger. Bleeding controlled. Full ROM.   Psychiatric: She has a normal mood and affect. Her behavior is normal.    ED Course  Procedures (including critical care time)  DIAGNOSTIC STUDIES: Oxygen Saturation is 100% on room air, normal by my interpretation.    COORDINATION OF CARE:  8:10 PM- Discussed treatment plan with patient, which includes suturing, and the patient agreed to the plan.   8:24 PM- LACERATION REPAIR PROCEDURE NOTE The patient's identification was confirmed and consent was obtained. This procedure was performed by Marlon Pel, PA-C, at 8:24 PM. Site: Right index finger Sterile procedures observed Anesthetic used (type and amt): lidocaine 2 % without epi Suture type/size: 6.0 Vicryl Length: 0.25 cm # of  Sutures: 1 Technique: simple  Complexity: interrupted Antibx ointment applied  Tetanus UTD  Site anesthetized, irrigated with NS, explored without evidence of foreign body, wound well approximated, site covered with dry, sterile dressing.  Patient tolerated procedure well without complications. Instructions for care discussed verbally and patient provided with additional written instructions for homecare and f/u.   Labs Review Labs Reviewed - No data to display Imaging Review Dg Hand Complete Right  12/24/2012   CLINICAL DATA:  Crush injury right index finger.  Pain.  EXAM:  RIGHT HAND - COMPLETE 3+ VIEW  COMPARISON:  None.  FINDINGS: There appears to be gas in the dorsal soft tissues about the distal phalanx of the index finger. No fracture or foreign body is identified. No dislocation.  IMPRESSION: Negative for fracture or foreign body. Gas the soft tissues about the distal aspect of the index finger dorsally is compatible laceration.   Electronically Signed   By: Drusilla Kanner M.D.   On: 12/24/2012 19:31    EKG Interpretation   None       MDM  No diagnosis found.   22 y.o.[name]'s evaluation in the Emergency Department is complete. It has been determined that no acute conditions requiring further emergency intervention are present at this time. The patient/guardian have been advised of the diagnosis and plan. We have discussed signs and symptoms that warrant return to the ED, such as changes or worsening in symptoms.  Vital signs are stable at discharge. Filed Vitals:   12/24/12 2125  BP: 109/67  Pulse: 83  Temp:   Resp: 18    Patient/guardian has voiced understanding and agreed to follow-up with the PCP or specialist.  I personally performed the services described in this documentation, which was scribed in my presence. The recorded information has been reviewed and is accurate.   Dorthula Matas, PA-C 12/24/12 2208

## 2012-12-24 NOTE — ED Notes (Signed)
Laceration on the side of nail bed of right middle finger.  Pt currently denies any pain.  No bleeding noted.

## 2012-12-24 NOTE — ED Provider Notes (Signed)
Medical screening examination/treatment/procedure(s) were performed by non-physician practitioner and as supervising physician I was immediately available for consultation/collaboration.  EKG Interpretation   None        Doug Sou, MD 12/24/12 2357

## 2013-07-03 ENCOUNTER — Emergency Department (HOSPITAL_COMMUNITY)
Admission: EM | Admit: 2013-07-03 | Discharge: 2013-07-03 | Disposition: A | Discharge status: Home / Self Care | Attending: Emergency Medicine | Admitting: Emergency Medicine

## 2013-07-03 DIAGNOSIS — M7918 Myalgia, other site: Secondary | ICD-10-CM

## 2013-07-03 DIAGNOSIS — R0602 Shortness of breath: Secondary | ICD-10-CM | POA: Insufficient documentation

## 2013-07-03 DIAGNOSIS — N390 Urinary tract infection, site not specified: Secondary | ICD-10-CM

## 2013-07-03 DIAGNOSIS — Z862 Personal history of diseases of the blood and blood-forming organs and certain disorders involving the immune mechanism: Secondary | ICD-10-CM | POA: Insufficient documentation

## 2013-07-03 DIAGNOSIS — Z3202 Encounter for pregnancy test, result negative: Secondary | ICD-10-CM | POA: Insufficient documentation

## 2013-07-03 DIAGNOSIS — R071 Chest pain on breathing: Secondary | ICD-10-CM | POA: Insufficient documentation

## 2013-07-03 LAB — POC URINE PREG, ED: PREG TEST UR: NEGATIVE

## 2013-07-03 LAB — URINALYSIS, ROUTINE W REFLEX MICROSCOPIC
Bilirubin Urine: NEGATIVE
Glucose, UA: NEGATIVE mg/dL
Hgb urine dipstick: NEGATIVE
KETONES UR: 15 mg/dL — AB
Nitrite: POSITIVE — AB
PROTEIN: NEGATIVE mg/dL
Specific Gravity, Urine: 1.03 (ref 1.005–1.030)
UROBILINOGEN UA: 1 mg/dL (ref 0.0–1.0)
pH: 7 (ref 5.0–8.0)

## 2013-07-03 LAB — URINE MICROSCOPIC-ADD ON

## 2013-07-03 MED ORDER — NITROFURANTOIN MONOHYD MACRO 100 MG PO CAPS: 100.0000 mg | ORAL_CAPSULE | Freq: Two times a day (BID) | ORAL | Status: DC

## 2013-07-03 NOTE — Discharge Instructions (Signed)
At this time your providers do not feel your symptoms are caused by any emergent condition. Please followup with a primary care provider for continued evaluation of your pain symptoms. Use ibuprofen or Aleve to help with pain. You were found to have a urinary tract infection. Please take antibiotics as prescribed for your infection and have a recheck of your symptoms by a primary care provider. Return at any time for changing or worsening symptoms.    Urinary Tract Infection Urinary tract infections (UTIs) can develop anywhere along your urinary tract. Your urinary tract is your body's drainage system for removing wastes and extra water. Your urinary tract includes two kidneys, two ureters, a bladder, and a urethra. Your kidneys are a pair of bean-shaped organs. Each kidney is about the size of your fist. They are located below your ribs, one on each side of your spine. CAUSES Infections are caused by microbes, which are microscopic organisms, including fungi, viruses, and bacteria. These organisms are so small that they can only be seen through a microscope. Bacteria are the microbes that most commonly cause UTIs. SYMPTOMS  Symptoms of UTIs may vary by age and gender of the patient and by the location of the infection. Symptoms in young women typically include a frequent and intense urge to urinate and a painful, burning feeling in the bladder or urethra during urination. Older women and men are more likely to be tired, shaky, and weak and have muscle aches and abdominal pain. A fever may mean the infection is in your kidneys. Other symptoms of a kidney infection include pain in your back or sides below the ribs, nausea, and vomiting. DIAGNOSIS To diagnose a UTI, your caregiver will ask you about your symptoms. Your caregiver also will ask to provide a urine sample. The urine sample will be tested for bacteria and white blood cells. White blood cells are made by your body to help fight  infection. TREATMENT  Typically, UTIs can be treated with medication. Because most UTIs are caused by a bacterial infection, they usually can be treated with the use of antibiotics. The choice of antibiotic and length of treatment depend on your symptoms and the type of bacteria causing your infection. HOME CARE INSTRUCTIONS  If you were prescribed antibiotics, take them exactly as your caregiver instructs you. Finish the medication even if you feel better after you have only taken some of the medication.  Drink enough water and fluids to keep your urine clear or pale yellow.  Avoid caffeine, tea, and carbonated beverages. They tend to irritate your bladder.  Empty your bladder often. Avoid holding urine for long periods of time.  Empty your bladder before and after sexual intercourse.  After a bowel movement, women should cleanse from front to back. Use each tissue only once. SEEK MEDICAL CARE IF:   You have back pain.  You develop a fever.  Your symptoms do not begin to resolve within 3 days. SEEK IMMEDIATE MEDICAL CARE IF:   You have severe back pain or lower abdominal pain.  You develop chills.  You have nausea or vomiting.  You have continued burning or discomfort with urination. MAKE SURE YOU:   Understand these instructions.  Will watch your condition.  Will get help right away if you are not doing well or get worse. Document Released: 09/30/2004 Document Revised: 06/22/2011 Document Reviewed: 01/29/2011 [facility] IncExitCare Patient Information 2015 [place], [place]. This information is not intended to replace advice given to you by your health care provider. Make  sure you discuss any questions you have with your health care provider.    Musculoskeletal Pain Musculoskeletal pain is muscle and boney aches and pains. These pains can occur in any part of the body. Your caregiver may treat you without knowing the cause of the pain. They may treat you if blood or urine tests,  X-rays, and other tests were normal.  CAUSES There is often not a definite cause or reason for these pains. These pains may be caused by a type of germ (virus). The discomfort may also come from overuse. Overuse includes working out too hard when your body is not fit. Boney aches also come from weather changes. Bone is sensitive to atmospheric pressure changes. HOME CARE INSTRUCTIONS   Ask when your test results will be ready. Make sure you get your test results.  Only take over-the-counter or prescription medicines for pain, discomfort, or fever as directed by your caregiver. If you were given medications for your condition, do not drive, operate machinery or power tools, or sign legal documents for 24 hours. Do not drink alcohol. Do not take sleeping pills or other medications that may interfere with treatment.  Continue all activities unless the activities cause more pain. When the pain lessens, slowly resume normal activities. Gradually increase the intensity and duration of the activities or exercise.  During periods of severe pain, bed rest may be helpful. Lay or sit in any position that is comfortable.  Putting ice on the injured area.  Put ice in a bag.  Place a towel between your skin and the bag.  Leave the ice on for 15 to 20 minutes, 3 to 4 times a day.  Follow up with your caregiver for continued problems and no reason can be found for the pain. If the pain becomes worse or does not go away, it may be necessary to repeat tests or do additional testing. Your caregiver may need to look further for a possible cause. SEEK IMMEDIATE MEDICAL CARE IF:  You have pain that is getting worse and is not relieved by medications.  You develop chest pain that is associated with shortness or breath, sweating, feeling sick to your stomach (nauseous), or throw up (vomit).  Your pain becomes localized to the abdomen.  You develop any new symptoms that seem different or that concern you. MAKE  SURE YOU:   Understand these instructions.  Will watch your condition.  Will get help right away if you are not doing well or get worse. Document Released: 12/21/2004 Document Revised: 03/15/2011 Document Reviewed: 08/25/2012 [facility] Patient Information 2015 [place], [place]. This information is not intended to replace advice given to you by your health care provider. Make sure you discuss any questions you have with your health care provider.

## 2013-07-03 NOTE — ED Provider Notes (Signed)
CSN: [id number]     Arrival date & time 07/03/13  1916 History   First MD Initiated Contact with Patient 07/03/13 2042     Chief Complaint  Patient presents with  . Flank Pain   HPI  History provided by the patient. Patient is a 23 year old female with history of G6 PD deficiency presenting with some intermittent complaints of left rib and chest wall pain. She reports feeling pain around the left lateral chest wall and side near her armpit area. Pain may come at any time regardless of activity. It is usually brief lasting only a few minutes. It feels sore and sharp. Denies having similar symptoms previously. There is no associated shortness of breath. There is no nausea or vomiting. She does report a prior history to remove a cyst under the skin near the breast and armpit area many years ago but this has never caused any of these symptoms before. There has not been any skin changes or swelling. Patient also has secondary complaint of some increased urinary frequency and urgency. This has been going on for a few days. Denies any dysuria or hematuria. Last known mental cycle was June 5. No vaginal bleeding or discharge. Denies any abdominal pains. No recent travel. Does not use estrogen or birth control. No cough or hemoptysis. No prior history of DVT or PE. No fever, chills or sweats.    Past Medical History  Diagnosis Date  . No pertinent past medical history   . G6PD deficiency    Past Surgical History  Procedure Laterality Date  . No past surgeries    . Breast surgery     Family History  Problem Relation Age of Onset  . Diabetes Mother    History  Substance Use Topics  . Smoking status: Never Smoker   . Smokeless tobacco: Never Used  . Alcohol Use: Yes   OB History   Grav Para Term Preterm Abortions TAB SAB Ect Mult Living   0 0 0 0 0 0 0 0 0 0      Review of Systems  Constitutional: Negative for fever, chills and diaphoresis.  Respiratory: Positive for shortness of breath.    Cardiovascular: Positive for chest pain. Negative for palpitations.  Gastrointestinal: Negative for nausea, vomiting, abdominal pain, diarrhea and constipation.  Genitourinary: Positive for urgency and frequency. Negative for dysuria, hematuria, flank pain, vaginal bleeding, vaginal discharge and menstrual problem.  Musculoskeletal: Positive for back pain.  All other systems reviewed and are negative.     Allergies  Shellfish allergy; Aspirin; and Sulfa antibiotics  Home Medications   Prior to Admission medications   Medication Sig Start Date End Date Taking? Authorizing Provider  EPINEPHrine (EPIPEN) 0.3 mg/0.3 mL SOAJ injection Inject 0.3 mLs (0.3 mg total) into the muscle once. 10/21/12   Ethelda ChickKristi M Smith, MD   BP 117/68  Pulse 94  Temp(Src) 98.4 F (36.9 C) (Oral)  Resp 19  Ht 5\' 1"  (1.549 m)  Wt 135 lb 9.6 oz (61.508 kg)  BMI 25.63 kg/m2  SpO2 100%  LMP 06/08/2013 Physical Exam  Nursing note and vitals reviewed. Constitutional: She is oriented to person, place, and time. She appears well-developed and well-nourished. No distress.  HENT:  Head: Normocephalic.  Cardiovascular: Normal rate and regular rhythm.   Pulmonary/Chest: Effort normal and breath sounds normal. No respiratory distress. She has no wheezes. She has no rales. She exhibits no tenderness.  Abdominal: Soft. She exhibits no distension. There is no tenderness. There is no rebound and  no guarding.  Musculoskeletal: Normal range of motion. She exhibits no edema and no tenderness.  No clinical signs concerning for DVT  Neurological: She is alert and oriented to person, place, and time.  Skin: Skin is warm and dry. No rash noted.  Psychiatric: She has a normal mood and affect. Her behavior is normal.      ED Course  Procedures   COORDINATION OF CARE:  Nursing notes reviewed. Vital signs reviewed. Initial pt interview and examination performed.   Filed Vitals:   07/03/13 1933 07/03/13 2035 07/03/13  2036 07/03/13 2037  BP: 120/70 117/68 117/68   Pulse: 89 81  94  Temp: 98.4 F (36.9 C)     TempSrc: Oral     Resp: 16 19    Height: 5\' 1"  (1.549 m)     Weight: 135 lb 9.6 oz (61.508 kg)     SpO2: 97% 100%  100%    9:42 PM-patient seen and evaluated. She appears well in no acute distress. She has atypical intermittent brief sharp pains to the left lateral side and back. No tenderness or deformities palpation. No CVA tenderness. UA does show signs concerning for UTI. Patient also with symptoms of frequency and urgency. We'll plan to treat with Macrobid. Patient is PERC negative. No significant risk factors for ACS and atypical symptoms. At this time we'll recommend treatment with ibuprofen or Aleve and to followup with PCP.    Results for orders placed during the hospital encounter of 07/03/13  URINALYSIS, ROUTINE W REFLEX MICROSCOPIC      Result Value Ref Range   Color, Urine YELLOW  YELLOW   APPearance HAZY (*) CLEAR   Specific Gravity, Urine 1.030  1.005 - 1.030   pH 7.0  5.0 - 8.0   Glucose, UA NEGATIVE  NEGATIVE mg/dL   Hgb urine dipstick NEGATIVE  NEGATIVE   Bilirubin Urine NEGATIVE  NEGATIVE   Ketones, ur 15 (*) NEGATIVE mg/dL   Protein, ur NEGATIVE  NEGATIVE mg/dL   Urobilinogen, UA 1.0  0.0 - 1.0 mg/dL   Nitrite POSITIVE (*) NEGATIVE   Leukocytes, UA SMALL (*) NEGATIVE  URINE MICROSCOPIC-ADD ON      Result Value Ref Range   Squamous Epithelial / LPF MANY (*) RARE   WBC, UA 3-6  <3 WBC/hpf   RBC / HPF 0-2  <3 RBC/hpf   Bacteria, UA FEW (*) RARE   Urine-Other MUCOUS PRESENT    POC URINE PREG, ED      Result Value Ref Range   Preg Test, Ur NEGATIVE  NEGATIVE       MDM   Final diagnoses:  Musculoskeletal pain  UTI (lower urinary tract infection)       Angus SellerPeter S Dammen, PA-C 07/03/13 2204

## 2013-07-03 NOTE — ED Notes (Signed)
Patient here from home with complaint of left lateral upper body pain. States that she is unsure whether the pain is coming from her abdomen, back, or ribs. States that she has been having frequent urination, but denies other urinary symptoms at this time. No reported fevers. Pain is increased with physical activity.

## 2013-07-04 LAB — RPR

## 2013-07-04 NOTE — ED Provider Notes (Signed)
Medical screening examination/treatment/procedure(s) were performed by non-physician practitioner and as supervising physician I was immediately available for consultation/collaboration.   EKG Interpretation None      I noticed that the pt was prescribed nitrofurantion. I called her and she stated that she had not filled the Rx yet. I informed her not to fill it and that I would change the Rx to keflex d/t her hx of G6PD def. I called the pharmacy and changed the Rx.   Junius ArgyleForrest S Harrison, MD 07/04/13 1401

## 2013-07-05 LAB — HIV ANTIBODY (ROUTINE TESTING W REFLEX): HIV: NONREACTIVE

## 2013-09-18 ENCOUNTER — Ambulatory Visit (INDEPENDENT_AMBULATORY_CARE_PROVIDER_SITE_OTHER): Admitting: Family Medicine

## 2013-09-18 ENCOUNTER — Ambulatory Visit (INDEPENDENT_AMBULATORY_CARE_PROVIDER_SITE_OTHER)

## 2013-09-18 VITALS — BP 108/66 | HR 96 | Temp 98.1°F | Resp 16 | Ht 62.0 in | Wt 137.6 lb

## 2013-09-18 DIAGNOSIS — M79601 Pain in right arm: Secondary | ICD-10-CM

## 2013-09-18 DIAGNOSIS — M25579 Pain in unspecified ankle and joints of unspecified foot: Secondary | ICD-10-CM

## 2013-09-18 DIAGNOSIS — M25572 Pain in left ankle and joints of left foot: Secondary | ICD-10-CM

## 2013-09-18 DIAGNOSIS — M79609 Pain in unspecified limb: Secondary | ICD-10-CM

## 2013-09-18 MED ORDER — PREDNISONE 20 MG PO TABS: 20.0000 mg | ORAL_TABLET | Freq: Every day | ORAL | Status: DC
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
## 2013-09-18 NOTE — Patient Instructions (Signed)

## 2013-10-01 ENCOUNTER — Encounter (HOSPITAL_COMMUNITY): Payer: Self-pay | Admitting: Emergency Medicine

## 2013-10-01 ENCOUNTER — Emergency Department (HOSPITAL_COMMUNITY)
Admission: EM | Admit: 2013-10-01 | Discharge: 2013-10-02 | Disposition: A | Discharge status: Home / Self Care | Attending: Emergency Medicine | Admitting: Emergency Medicine

## 2013-10-01 DIAGNOSIS — R1031 Right lower quadrant pain: Secondary | ICD-10-CM | POA: Insufficient documentation

## 2013-10-01 DIAGNOSIS — R109 Unspecified abdominal pain: Secondary | ICD-10-CM | POA: Insufficient documentation

## 2013-10-01 DIAGNOSIS — R3919 Other difficulties with micturition: Secondary | ICD-10-CM | POA: Diagnosis not present

## 2013-10-01 DIAGNOSIS — R350 Frequency of micturition: Secondary | ICD-10-CM | POA: Insufficient documentation

## 2013-10-01 DIAGNOSIS — N12 Tubulo-interstitial nephritis, not specified as acute or chronic: Secondary | ICD-10-CM | POA: Diagnosis not present

## 2013-10-01 DIAGNOSIS — Z3202 Encounter for pregnancy test, result negative: Secondary | ICD-10-CM | POA: Diagnosis not present

## 2013-10-01 DIAGNOSIS — Z79899 Other long term (current) drug therapy: Secondary | ICD-10-CM | POA: Insufficient documentation

## 2013-10-01 NOTE — ED Notes (Signed)
Pt reports R flank pain x 2.5 weeks.  States she normally does not drink water like she's supposed to. Sometimes she only urinates once a day.  Pt reports dark yellow urine, denies dysuria.

## 2013-10-02 LAB — COMPREHENSIVE METABOLIC PANEL
ALK PHOS: 63 U/L (ref 39–117)
ALT: 13 U/L (ref 0–35)
AST: 23 U/L (ref 0–37)
Albumin: 4 g/dL (ref 3.5–5.2)
Anion gap: 16 — ABNORMAL HIGH (ref 5–15)
BILIRUBIN TOTAL: 0.4 mg/dL (ref 0.3–1.2)
BUN: 16 mg/dL (ref 6–23)
CHLORIDE: 100 meq/L (ref 96–112)
CO2: 18 meq/L — AB (ref 19–32)
Calcium: 9.2 mg/dL (ref 8.4–10.5)
Creatinine, Ser: 0.77 mg/dL (ref 0.50–1.10)
GFR calc Af Amer: >90 mL/min (ref >90)
GFR calc non Af Amer: >90 mL/min (ref >90)
Glucose, Bld: 86 mg/dL (ref 70–99)
Potassium: 4.3 mEq/L (ref 3.7–5.3)
Sodium: 134 mEq/L — ABNORMAL LOW (ref 137–147)
Total Protein: 9 g/dL — ABNORMAL HIGH (ref 6.0–8.3)

## 2013-10-02 LAB — URINALYSIS, ROUTINE W REFLEX MICROSCOPIC
BILIRUBIN URINE: NEGATIVE
GLUCOSE, UA: NEGATIVE mg/dL
Hgb urine dipstick: NEGATIVE
Ketones, ur: NEGATIVE mg/dL
NITRITE: POSITIVE — AB
PH: 6 (ref 5.0–8.0)
Protein, ur: NEGATIVE mg/dL
SPECIFIC GRAVITY, URINE: 1.026 (ref 1.005–1.030)
Urobilinogen, UA: 0.2 mg/dL (ref 0.0–1.0)

## 2013-10-02 LAB — CBC WITH DIFFERENTIAL/PLATELET
Basophils Absolute: 0 10*3/uL (ref 0.0–0.1)
Basophils Relative: 0 % (ref 0–1)
Eosinophils Absolute: 0.1 10*3/uL (ref 0.0–0.7)
Eosinophils Relative: 1 % (ref 0–5)
HCT: 41 % (ref 36.0–46.0)
HEMOGLOBIN: 14.1 g/dL (ref 12.0–15.0)
LYMPHS ABS: 3.4 10*3/uL (ref 0.7–4.0)
LYMPHS PCT: 29 % (ref 12–46)
MCH: 32 pg (ref 26.0–34.0)
MCHC: 34.4 g/dL (ref 30.0–36.0)
MCV: 93 fL (ref 78.0–100.0)
Monocytes Absolute: 0.7 10*3/uL (ref 0.1–1.0)
Monocytes Relative: 6 % (ref 3–12)
NEUTROS PCT: 64 % (ref 43–77)
Neutro Abs: 7.7 10*3/uL (ref 1.7–7.7)
Platelets: 305 10*3/uL (ref 150–400)
RBC: 4.41 MIL/uL (ref 3.87–5.11)
RDW: 11.3 % — ABNORMAL LOW (ref 11.5–15.5)
WBC: 11.9 10*3/uL — AB (ref 4.0–10.5)

## 2013-10-02 LAB — URINE MICROSCOPIC-ADD ON

## 2013-10-02 LAB — POC URINE PREG, ED: Preg Test, Ur: NEGATIVE

## 2013-10-02 MED ORDER — HYDROCODONE-ACETAMINOPHEN 5-325 MG PO TABS: 1.0000 | ORAL_TABLET | Freq: Four times a day (QID) | ORAL | Status: AC | PRN

## 2013-10-02 MED ORDER — ONDANSETRON HCL 4 MG PO TABS: 4.0000 mg | ORAL_TABLET | Freq: Four times a day (QID) | ORAL | Status: AC

## 2013-10-02 MED ORDER — DEXTROSE 5 % IV SOLN
1.0000 g | Freq: Once | INTRAVENOUS | Status: AC
  Administered 2013-10-02: 1 g via INTRAVENOUS
  Filled 2013-10-02: qty 10

## 2013-10-02 MED ORDER — SODIUM CHLORIDE 0.9 % IV BOLUS (SEPSIS)
1000.0000 mL | Freq: Once | INTRAVENOUS | Status: AC
  Administered 2013-10-02: 1000 mL via INTRAVENOUS

## 2013-10-02 MED ORDER — CEPHALEXIN 500 MG PO CAPS: 500.0000 mg | ORAL_CAPSULE | Freq: Four times a day (QID) | ORAL | Status: AC

## 2013-10-02 NOTE — ED Provider Notes (Signed)
CSN: [id number]     Arrival date & time 10/01/13  2027 History   First MD Initiated Contact with Patient 10/02/13 0003     Chief Complaint  Patient presents with  . Flank Pain     (Consider location/radiation/quality/duration/timing/severity/associated sxs/prior Treatment) HPI Comments: Patient is a 23 year old female with history of G6PD deficiency who presents to ED for evaluation of right flank pain. She reports that this has been gradually worsening for the past 2 and half weeks. She describes the pain as throbbing and achy. She took ibuprofen with little relief of her symptoms. Her last dose was on Friday. She has urinary urgency, but states she has decreased urination. She has history of prior urinary tract infection. She denies any fever, chills, nausea, vomiting, diarrhea. She denies having history of kidney stones.  Patient is a 23 y.o. female presenting with flank pain. The history is provided by the patient. No language interpreter was used.  Flank Pain Pertinent negatives include no abdominal pain, chest pain, fever, nausea or vomiting.    Past Medical History  Diagnosis Date  . No pertinent past medical history   . G6PD deficiency    Past Surgical History  Procedure Laterality Date  . No past surgeries    . Breast surgery     Family History  Problem Relation Age of Onset  . Diabetes Mother    History  Substance Use Topics  . Smoking status: Never Smoker   . Smokeless tobacco: Never Used  . Alcohol Use: Yes   OB History   Grav Para Term Preterm Abortions TAB SAB Ect Mult Living   0 0 0 0 0 0 0 0 0 0      Review of Systems  Constitutional: Negative for fever.  Respiratory: Negative for shortness of breath.   Cardiovascular: Negative for chest pain.  Gastrointestinal: Negative for nausea, vomiting and abdominal pain.  Genitourinary: Positive for urgency, flank pain and difficulty urinating. Negative for dysuria and hematuria.  All other systems reviewed and  are negative.     Allergies  Shellfish allergy; Aspirin; and Sulfa antibiotics  Home Medications   Prior to Admission medications   Medication Sig Start Date End Date Taking? Authorizing Provider  EPINEPHrine (EPIPEN) 0.3 mg/0.3 mL SOAJ injection Inject 0.3 mLs (0.3 mg total) into the muscle once. 10/21/12  Yes Ethelda ChickKristi M Smith, MD  etonogestrel (NEXPLANON) 68 MG IMPL implant Inject 1 each into the skin once.   Yes Historical Provider, MD   BP 111/61  Pulse 80  Temp(Src) 98.3 F (36.8 C) (Oral)  Resp 18  Wt 134 lb (60.782 kg)  SpO2 100%  LMP 08/08/2013 Physical Exam  Nursing note and vitals reviewed. Constitutional: She is oriented to person, place, and time. She appears well-developed and well-nourished.  Non-toxic appearance. She does not have a sickly appearance. She does not appear ill. No distress.  Generally well appearing  HENT:  Head: Normocephalic and atraumatic.  Right Ear: External ear normal.  Left Ear: External ear normal.  Nose: Nose normal.  Mouth/Throat: Oropharynx is clear and moist.  Eyes: Conjunctivae are normal.  Neck: Normal range of motion.  Cardiovascular: Normal rate, regular rhythm and normal heart sounds.   Pulmonary/Chest: Effort normal and breath sounds normal. No stridor. No respiratory distress. She has no wheezes. She has no rales.  Abdominal: Soft. She exhibits no distension. There is tenderness in the right lower quadrant. There is CVA tenderness (right). There is no rigidity, no rebound and no guarding.  Musculoskeletal: Normal range of motion.  Neurological: She is alert and oriented to person, place, and time. She has normal strength.  Skin: Skin is warm and dry. She is not diaphoretic. No erythema.  Psychiatric: She has a normal mood and affect. Her behavior is normal.    ED Course  Procedures (including critical care time) Labs Review Labs Reviewed  URINALYSIS, ROUTINE W REFLEX MICROSCOPIC - Abnormal; Notable for the following:      APPearance CLOUDY (*)    Nitrite POSITIVE (*)    Leukocytes, UA SMALL (*)    All other components within normal limits  CBC WITH DIFFERENTIAL - Abnormal; Notable for the following:    WBC 11.9 (*)    RDW 11.3 (*)    All other components within normal limits  COMPREHENSIVE METABOLIC PANEL - Abnormal; Notable for the following:    Sodium 134 (*)    CO2 18 (*)    Total Protein 9.0 (*)    Anion gap 16 (*)    All other components within normal limits  URINE MICROSCOPIC-ADD ON - Abnormal; Notable for the following:    Bacteria, UA MANY (*)    All other components within normal limits  POC URINE PREG, ED    Imaging Review No results found.   EKG Interpretation None      MDM   Final diagnoses:  Pyelonephritis    Patient presents to ED with right flank pain. Patient with UTI, likely early pyelonephritis. Patient is afebrile, non toxic, non septic in appearance. Will given IV rocephin in ED. Patient's labs are grossly unremarkable. Will discharge with abx. Discussed strict return instructions with patient. Vital signs stable for discharge. Patient / Family / Caregiver informed of clinical course, understand medical decision-making process, and agree with plan.     Mora Bellman, PA-C 10/02/13 [phone]

## 2013-10-02 NOTE — Discharge Instructions (Signed)
Pyelonephritis, Adult °Pyelonephritis is a kidney infection. A kidney infection can happen quickly, or it can last for a long time. °HOME CARE  °· Take your medicine (antibiotics) as told. Finish it even if you start to feel better. °· Keep all doctor visits as told. °· Drink enough fluids to keep your pee (urine) clear or pale yellow. °· Only take medicine as told by your doctor. °GET HELP RIGHT AWAY IF:  °· You have a fever or lasting symptoms for more than 2-3 days. °· You have a fever and your symptoms suddenly get worse. °· You cannot take your medicine or drink fluids as told. °· You have chills and shaking. °· You feel very weak or pass out (faint). °· You do not feel better after 2 days. °MAKE SURE YOU: °· Understand these instructions. °· Will watch your condition. °· Will get help right away if you are not doing well or get worse. °Document Released: 01/29/2004 Document Revised: 06/22/2011 Document Reviewed: 06/10/2010 °ExitCare® Patient Information ©2015 [employer]. This information is not intended to replace advice given to you by your health care provider. Make sure you discuss any questions you have with your health care provider. ° °

## 2013-10-03 NOTE — ED Provider Notes (Signed)
Medical screening examination/treatment/procedure(s) were performed by non-physician practitioner and as supervising physician I was immediately available for consultation/collaboration.   EKG Interpretation None        Savannah Parrish M Lynda Wanninger, MD 10/03/13 [phone]

## 2014-04-23 ENCOUNTER — Ambulatory Visit (INDEPENDENT_AMBULATORY_CARE_PROVIDER_SITE_OTHER): Admitting: Emergency Medicine

## 2014-04-23 VITALS — BP 116/68 | HR 105 | Temp 98.3°F | Resp 17 | Ht 62.0 in | Wt 147.0 lb

## 2014-04-23 DIAGNOSIS — N912 Amenorrhea, unspecified: Secondary | ICD-10-CM | POA: Diagnosis not present

## 2014-04-23 DIAGNOSIS — J0140 Acute pansinusitis, unspecified: Secondary | ICD-10-CM | POA: Diagnosis not present

## 2014-04-23 LAB — POCT URINE PREGNANCY: Preg Test, Ur: NEGATIVE

## 2014-04-23 LAB — HCG, QUANTITATIVE, PREGNANCY: hCG, Beta Chain, Quant, S: <2 m[IU]/mL

## 2014-04-23 MED ORDER — HYDROCOD POLST-CPM POLST ER 10-8 MG/5ML PO SUER: 5.0000 mL | Freq: Two times a day (BID) | ORAL | Status: AC

## 2014-04-23 MED ORDER — AMOXICILLIN-POT CLAVULANATE 875-125 MG PO TABS: 1.0000 | ORAL_TABLET | Freq: Two times a day (BID) | ORAL | Status: AC

## 2014-04-23 MED ORDER — PSEUDOEPHEDRINE-GUAIFENESIN ER 60-600 MG PO TB12: 1.0000 | ORAL_TABLET | Freq: Two times a day (BID) | ORAL | Status: AC

## 2014-04-23 NOTE — Progress Notes (Signed)
[facility] [address], [place] [address] [phone]  Date:  04/23/2014   Name:  Savannah Parrish   DOB:  October 13, 1990   MRN:  [id number]  PCP:  No PCP Per Patient    Chief Complaint: Sore Throat; Cough; and Possible Pregnancy   History of Present Illness:  Savannah Parrish is a 24 y.o. very pleasant female patient who presents with the following:  Took nexplanon out in December. No menses since February.  Has had a number of negative HPT Sexually active with no contraception Has two week history of nasal congestion and purulent discharge Malaise and myalgia Post nasal drip No sore throat Cough largely not productive No wheezing or shortness of breath Some purulent sputum Cough worse at night No nausea or vomiting Has feeling of fever and chills not documented. No improvement with over the counter medications or other home remedies.  Denies other complaint or health concern today.   Patient Active Problem List   Diagnosis Date Noted  . Shellfish allergy 10/21/2012  . G6P deficiency (glucose-6-phosphatase deficiency) 10/21/2012    Past Medical History  Diagnosis Date  . No pertinent past medical history   . G6PD deficiency     Past Surgical History  Procedure Laterality Date  . No past surgeries    . Breast surgery      History  Substance Use Topics  . Smoking status: Never Smoker   . Smokeless tobacco: Never Used  . Alcohol Use: Yes    Family History  Problem Relation Age of Onset  . Diabetes Mother     Allergies  Allergen Reactions  . Shellfish Allergy Anaphylaxis    Throat closes  . Aspirin Swelling  . Sulfa Antibiotics Other (See Comments)    Ineffective per patient.     Medication list has been reviewed and updated.  Current Outpatient Prescriptions on File Prior to Visit  Medication Sig Dispense Refill  . EPINEPHrine (EPIPEN) 0.3 mg/0.3 mL SOAJ injection Inject 0.3 mLs (0.3 mg total) into the muscle once. 2  Device 3  . cephALEXin (KEFLEX) 500 MG capsule Take 1 capsule (500 mg total) by mouth 4 (four) times daily. (Patient not taking: Reported on 04/23/2014) 40 capsule 0  . etonogestrel (NEXPLANON) 68 MG IMPL implant Inject 1 each into the skin once.    Marland Kitchen HYDROcodone-acetaminophen (NORCO/VICODIN) 5-325 MG per tablet Take 1-2 tablets by mouth every 6 (six) hours as needed for moderate pain or severe pain. (Patient not taking: Reported on 04/23/2014) 20 tablet 0  . ondansetron (ZOFRAN) 4 MG tablet Take 1 tablet (4 mg total) by mouth every 6 (six) hours. (Patient not taking: Reported on 04/23/2014) 12 tablet 0   No current facility-administered medications on file prior to visit.    Review of Systems:  As per HPI, otherwise negative.    Physical Examination: Filed Vitals:   04/23/14 1431  BP: 116/68  Pulse: 105  Temp: 98.3 F (36.8 C)  Resp: 17   Filed Vitals:   04/23/14 1431  Height: 5\' 2"  (1.575 m)  Weight: 147 lb (66.679 kg)   Body mass index is 26.88 kg/(m^2). Ideal Body Weight: Weight in (lb) to have BMI = 25: 136.4  GEN: WDWN, NAD, Non-toxic, A & O x 3 HEENT: Atraumatic, Normocephalic. Neck supple. No masses, No LAD. Ears and Nose: No external deformity. CV: RRR, No M/G/R. No JVD. No thrill. No extra heart sounds. PULM: CTA B, no wheezes, crackles, rhonchi. No retractions. No resp.  distress. No accessory muscle use. ABD: S, NT, ND, +BS. No rebound. No HSM. EXTR: No c/c/e NEURO Normal gait.  PSYCH: Normally interactive. Conversant. Not depressed or anxious appearing.  Calm demeanor.    Assessment and Plan: Sinusitis augmentin mucinex tussionex Amenorrhea Beta sub  Signed,  Phillips Odor, MD   Results for orders placed or performed in visit on 04/23/14  POCT urine pregnancy  Result Value Ref Range   Preg Test, Ur Negative

## 2014-04-23 NOTE — Patient Instructions (Signed)

## 2016-01-07 IMAGING — CR DG FOREARM 2V*R*
2 series · 2 of 2 positions shown · non-contrast
Comparison: None.

CLINICAL DATA: Tenderness over the mid radial shaft

EXAM:
RIGHT FOREARM - 2 VIEW

[AP]
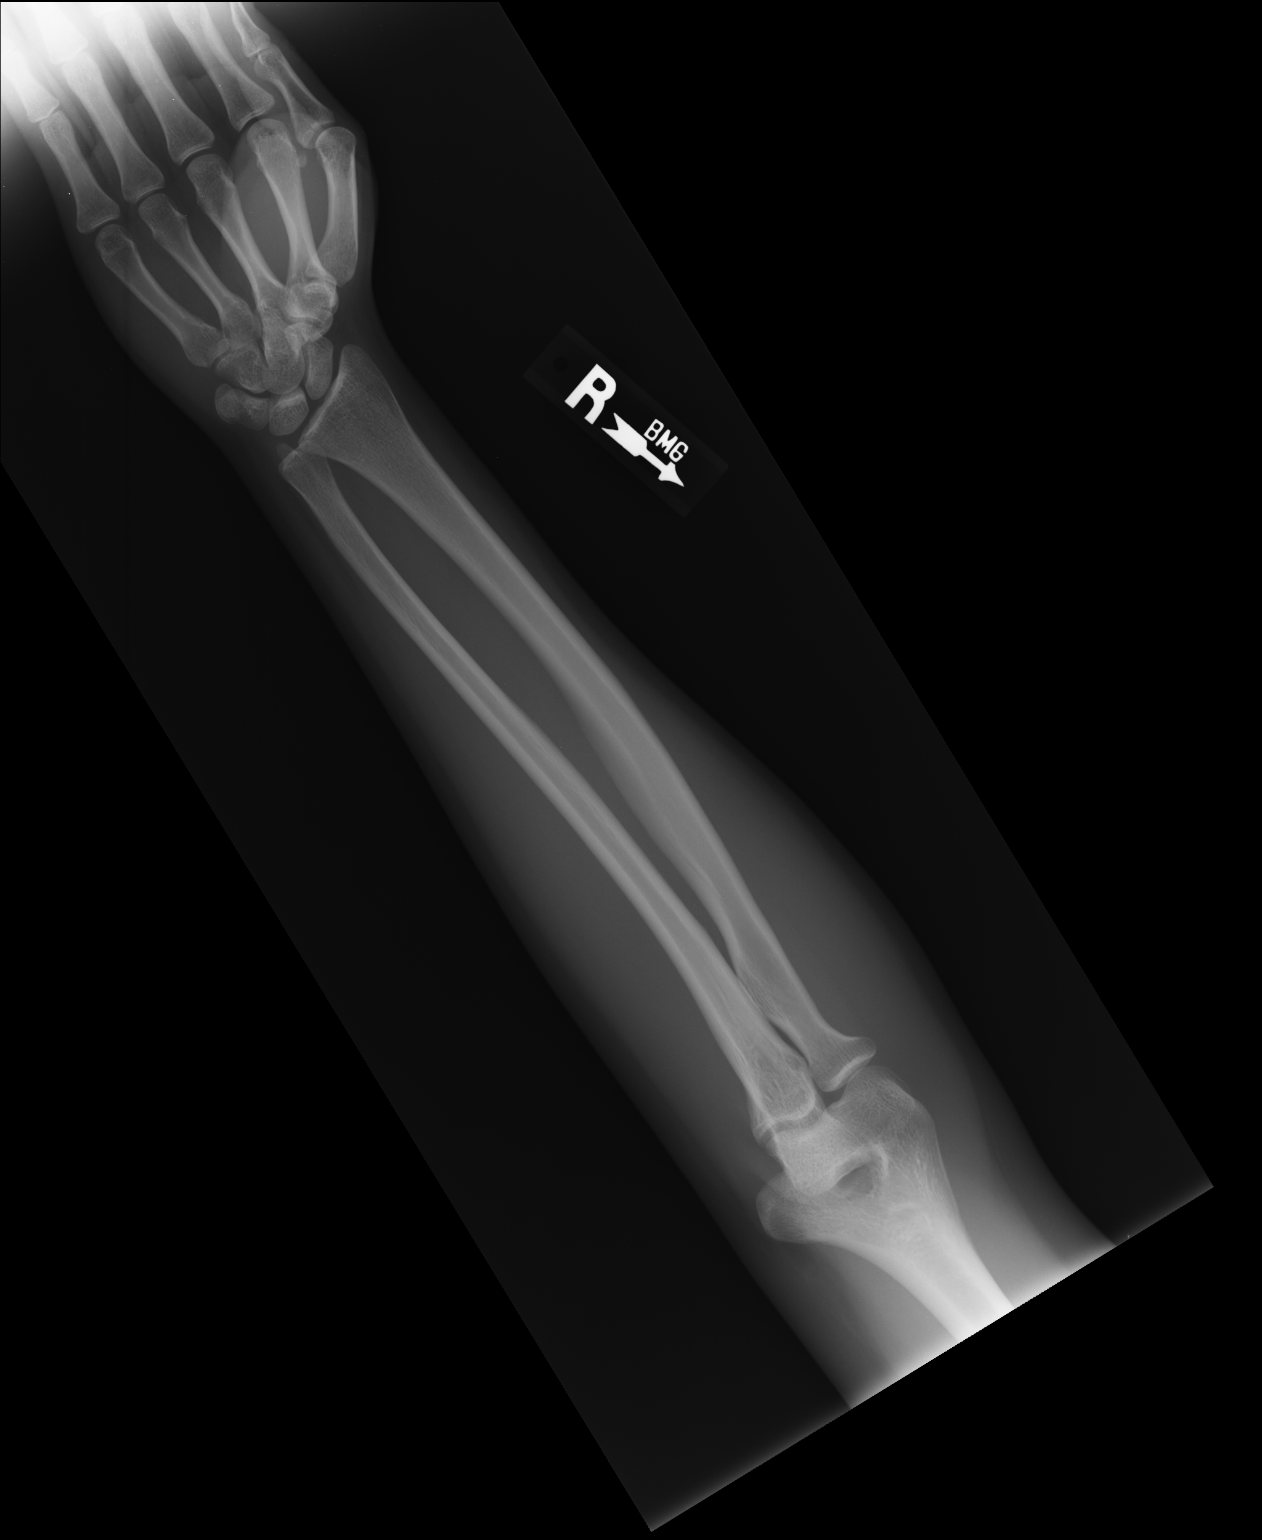

[lateral]
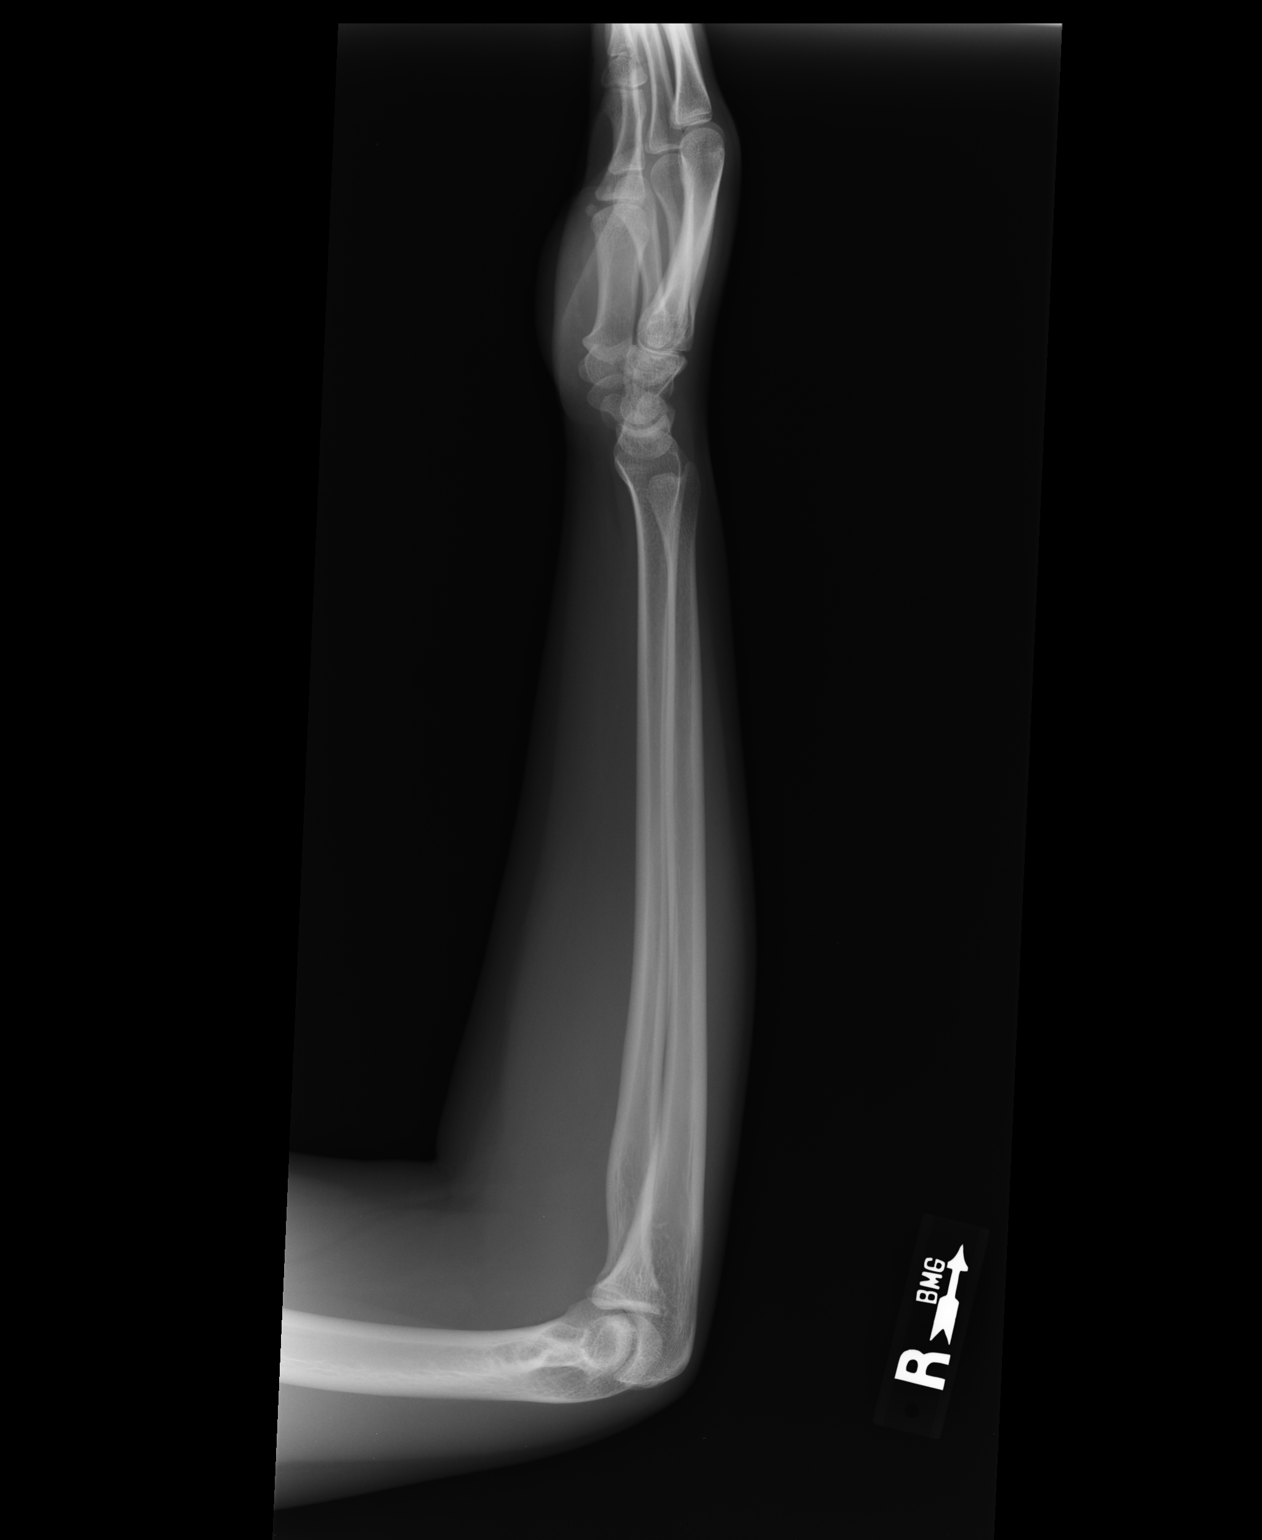

[2 of 2 positions shown; findings below may reference images not displayed]

FINDINGS: There is no evidence of fracture or other focal bone lesions. Soft
tissues are unremarkable.
IMPRESSION: Negative.
